# Patient Record
Sex: Female | Born: 1950 | Race: White | Hispanic: Yes | Marital: Married | Smoking: Never smoker
Health system: Southern US, Community
[De-identification: ages and names within clinical notes are randomized; demographics above are authoritative.]

## PROBLEM LIST (undated history)

## (undated) DIAGNOSIS — E079 Disorder of thyroid, unspecified: Secondary | ICD-10-CM

---

## 2006-12-09 ENCOUNTER — Ambulatory Visit: Payer: Self-pay | Admitting: Family Medicine

## 2006-12-09 ENCOUNTER — Ambulatory Visit: Payer: Self-pay | Admitting: *Deleted

## 2007-01-08 ENCOUNTER — Ambulatory Visit: Payer: Self-pay | Admitting: Internal Medicine

## 2007-01-20 ENCOUNTER — Encounter (INDEPENDENT_AMBULATORY_CARE_PROVIDER_SITE_OTHER): Payer: Self-pay | Admitting: Family Medicine

## 2007-01-20 LAB — CONVERTED CEMR LAB
Basophils Absolute: 0 10*3/uL (ref 0.0–0.1)
Basophils Relative: 1 % (ref 0–1)
HCT: 38.8 % (ref 36.0–46.0)
Lymphocytes Relative: 39 % (ref 12–46)
MCHC: 30.9 g/dL (ref 30.0–36.0)
MCV: 91.1 fL (ref 78.0–100.0)
Neutro Abs: 2.6 10*3/uL (ref 1.7–7.7)
RBC: 4.26 M/uL (ref 3.87–5.11)
WBC: 6.3 10*3/uL (ref 4.0–10.5)

## 2007-02-11 ENCOUNTER — Encounter (INDEPENDENT_AMBULATORY_CARE_PROVIDER_SITE_OTHER): Payer: Self-pay | Admitting: Nurse Practitioner

## 2007-02-11 ENCOUNTER — Ambulatory Visit: Payer: Self-pay | Admitting: Family Medicine

## 2007-02-11 LAB — CONVERTED CEMR LAB
ALT: 40 units/L — ABNORMAL HIGH (ref 0–35)
AST: 39 units/L — ABNORMAL HIGH (ref 0–37)
Alkaline Phosphatase: 126 units/L — ABNORMAL HIGH (ref 39–117)
CO2: 24 meq/L (ref 19–32)
Calcium: 9.8 mg/dL (ref 8.4–10.5)
Glucose, Bld: 96 mg/dL (ref 70–99)
Total Bilirubin: 0.4 mg/dL (ref 0.3–1.2)

## 2007-02-12 ENCOUNTER — Encounter (INDEPENDENT_AMBULATORY_CARE_PROVIDER_SITE_OTHER): Payer: Self-pay | Admitting: Nurse Practitioner

## 2007-02-12 LAB — CONVERTED CEMR LAB
Hep A Total Ab: POSITIVE — AB
Hep B Core Total Ab: NEGATIVE

## 2007-03-24 ENCOUNTER — Ambulatory Visit: Payer: Self-pay | Admitting: Family Medicine

## 2007-03-25 ENCOUNTER — Ambulatory Visit (HOSPITAL_COMMUNITY): Admission: RE | Admit: 2007-03-25 | Discharge: 2007-03-25 | Payer: Self-pay | Admitting: Family Medicine

## 2007-04-01 ENCOUNTER — Ambulatory Visit: Payer: Self-pay | Admitting: Family Medicine

## 2007-04-01 LAB — CONVERTED CEMR LAB: Free T4: 1.07 ng/dL (ref 0.89–1.80)

## 2007-04-24 ENCOUNTER — Ambulatory Visit: Payer: Self-pay | Admitting: Family Medicine

## 2007-04-29 ENCOUNTER — Ambulatory Visit (HOSPITAL_COMMUNITY): Admission: RE | Admit: 2007-04-29 | Discharge: 2007-04-29 | Payer: Self-pay | Admitting: Family Medicine

## 2007-07-16 ENCOUNTER — Ambulatory Visit: Payer: Self-pay | Admitting: Family Medicine

## 2007-08-07 ENCOUNTER — Ambulatory Visit: Payer: Self-pay | Admitting: Internal Medicine

## 2007-09-07 ENCOUNTER — Ambulatory Visit: Payer: Self-pay | Admitting: Family Medicine

## 2007-09-07 ENCOUNTER — Encounter (INDEPENDENT_AMBULATORY_CARE_PROVIDER_SITE_OTHER): Payer: Self-pay | Admitting: Family Medicine

## 2007-11-27 ENCOUNTER — Ambulatory Visit: Payer: Self-pay | Admitting: Family Medicine

## 2008-01-15 ENCOUNTER — Encounter: Admission: RE | Admit: 2008-01-15 | Discharge: 2008-01-15 | Payer: Self-pay | Admitting: Neurosurgery

## 2008-01-29 ENCOUNTER — Encounter: Admission: RE | Admit: 2008-01-29 | Discharge: 2008-01-29 | Payer: Self-pay | Admitting: Neurosurgery

## 2008-03-14 ENCOUNTER — Ambulatory Visit (HOSPITAL_COMMUNITY): Admission: RE | Admit: 2008-03-14 | Discharge: 2008-03-14 | Payer: Self-pay | Admitting: Neurosurgery

## 2008-03-18 ENCOUNTER — Inpatient Hospital Stay (HOSPITAL_COMMUNITY): Admission: RE | Admit: 2008-03-18 | Discharge: 2008-03-21 | Payer: Self-pay | Admitting: Neurosurgery

## 2008-05-31 ENCOUNTER — Emergency Department (HOSPITAL_COMMUNITY): Admission: EM | Admit: 2008-05-31 | Discharge: 2008-05-31 | Payer: Self-pay | Admitting: Emergency Medicine

## 2008-06-06 ENCOUNTER — Emergency Department (HOSPITAL_COMMUNITY): Admission: EM | Admit: 2008-06-06 | Discharge: 2008-06-06 | Payer: Self-pay | Admitting: Emergency Medicine

## 2008-06-14 ENCOUNTER — Ambulatory Visit: Payer: Self-pay | Admitting: Family Medicine

## 2008-06-14 LAB — CONVERTED CEMR LAB
ALT: 42 units/L — ABNORMAL HIGH (ref 0–35)
AST: 35 units/L (ref 0–37)
Albumin: 4.2 g/dL (ref 3.5–5.2)
Basophils Relative: 1 % (ref 0–1)
CO2: 21 meq/L (ref 19–32)
Chloride: 104 meq/L (ref 96–112)
Eosinophils Absolute: 0.8 10*3/uL — ABNORMAL HIGH (ref 0.0–0.7)
Eosinophils Relative: 11 % — ABNORMAL HIGH (ref 0–5)
Hemoglobin: 12.5 g/dL (ref 12.0–15.0)
Lymphs Abs: 3.2 10*3/uL (ref 0.7–4.0)
MCHC: 31.6 g/dL (ref 30.0–36.0)
MCV: 83.9 fL (ref 78.0–100.0)
Monocytes Relative: 9 % (ref 3–12)
Platelets: 294 10*3/uL (ref 150–400)
RDW: 14.2 % (ref 11.5–15.5)
Sodium: 138 meq/L (ref 135–145)
Total Bilirubin: 0.2 mg/dL — ABNORMAL LOW (ref 0.3–1.2)

## 2008-06-20 ENCOUNTER — Ambulatory Visit (HOSPITAL_COMMUNITY): Admission: RE | Admit: 2008-06-20 | Discharge: 2008-06-20 | Payer: Self-pay | Admitting: Family Medicine

## 2008-07-01 ENCOUNTER — Ambulatory Visit: Payer: Self-pay | Admitting: Family Medicine

## 2008-07-01 LAB — CONVERTED CEMR LAB
T3 Uptake Ratio: 31.2 % (ref 22.5–37.0)
T4, Total: 9.7 ug/dL (ref 5.0–12.5)

## 2008-07-26 ENCOUNTER — Encounter: Admission: RE | Admit: 2008-07-26 | Discharge: 2008-08-18 | Payer: Self-pay | Admitting: Neurosurgery

## 2010-07-16 ENCOUNTER — Encounter: Payer: Self-pay | Admitting: Neurosurgery

## 2010-08-20 IMAGING — CT CT CHEST W/ CM
2 of 3 series · 15 of 36 positions shown, 18 images · IV contrast (APPLIED)
Comparison: 05/31/2008 chest x-ray

CLINICAL DATA: Abnormal preoperative chest x-ray with left lower
lobe nodule.

CT CHEST WITH CONTRAST
TECHNIQUE: Multidetector CT imaging of the chest was performed
following the standard protocol during bolus administration of
intravenous contrast.
Contrast: 80 ml 8mnipaque-PII

[Series 2: routine chest 5.0 st · axial · 0.72mm/px · z∈[-274,-54]mm · 12 of 52 slices shown, 15 images]
[im 4/52  mediastinal]
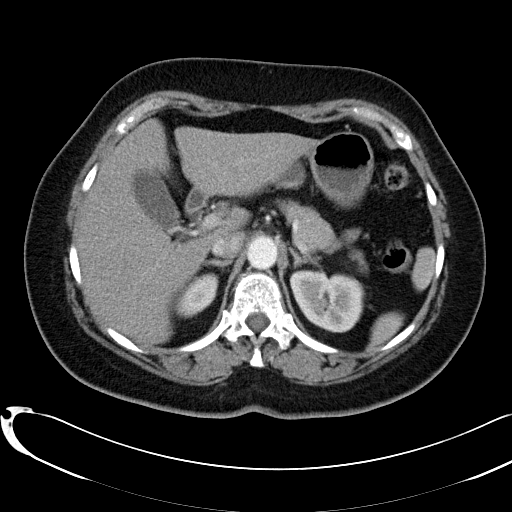
[im 4/52  lung]
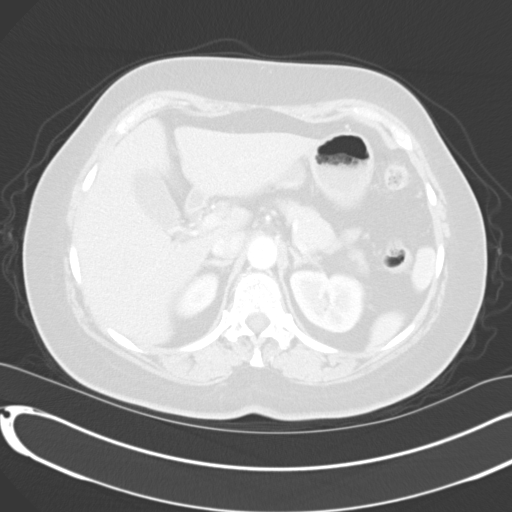
[im 8/52  lung]
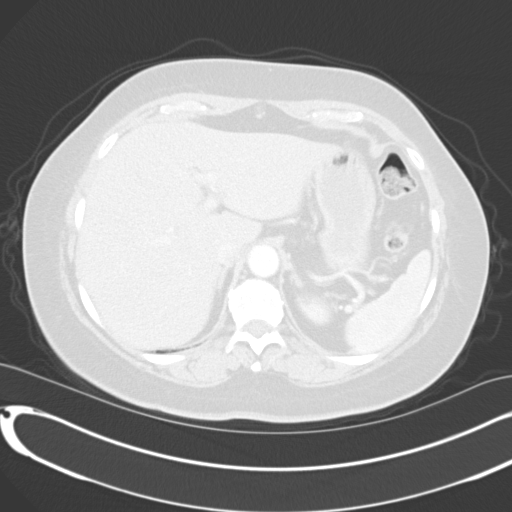
[im 12/52  lung]
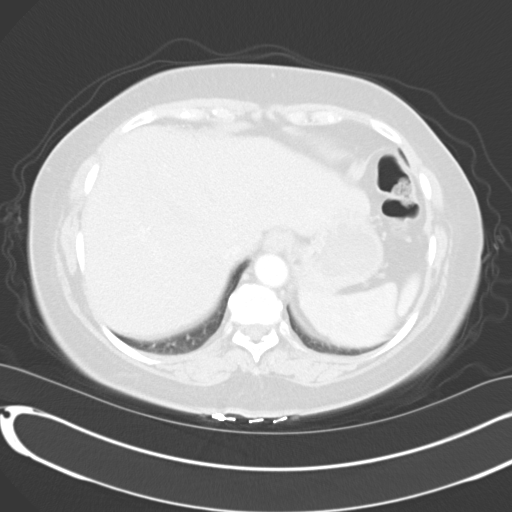
[im 16/52  lung]
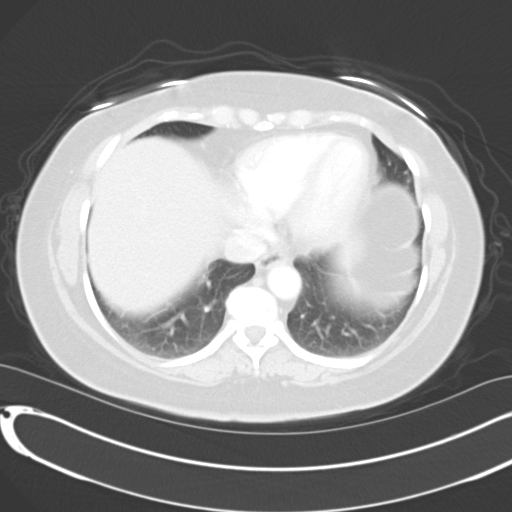
[im 19/52  mediastinal]
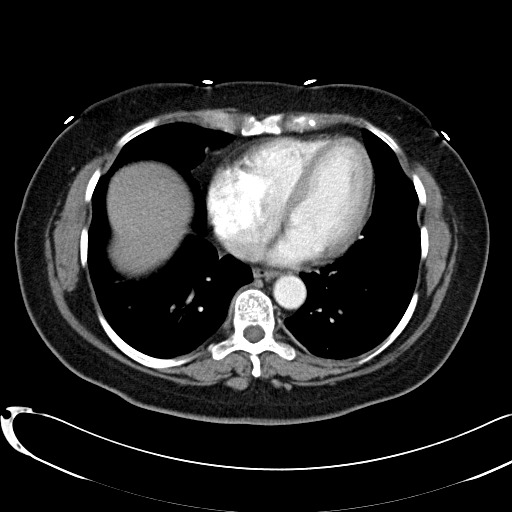
[im 19/52  lung]
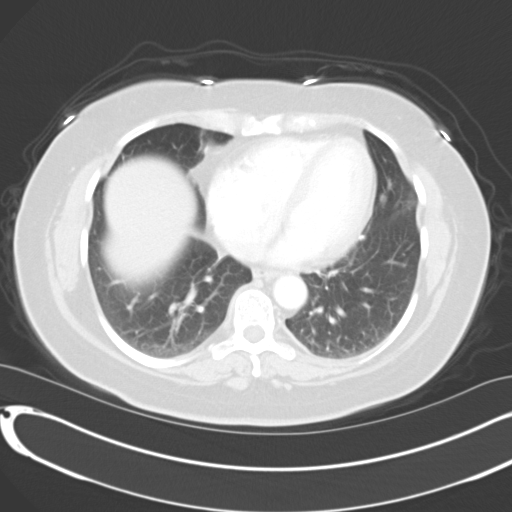
[im 23/52  lung]
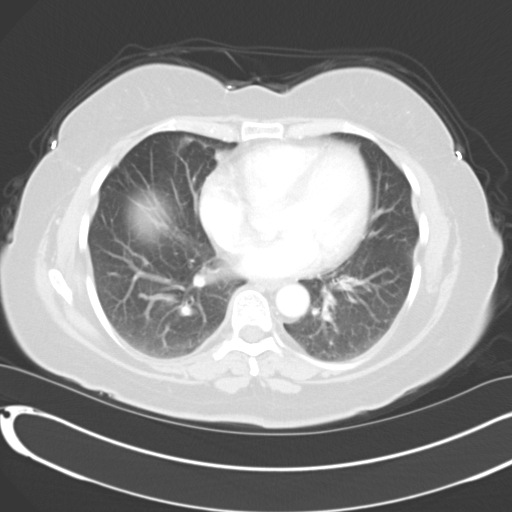
[im 29/52  lung]
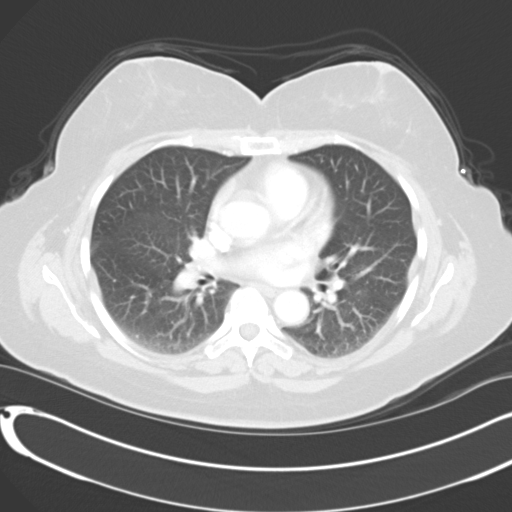
[im 33/52  lung]
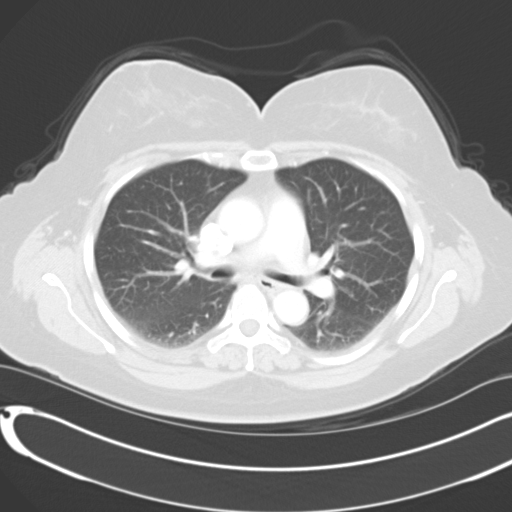
[im 36/52  mediastinal]
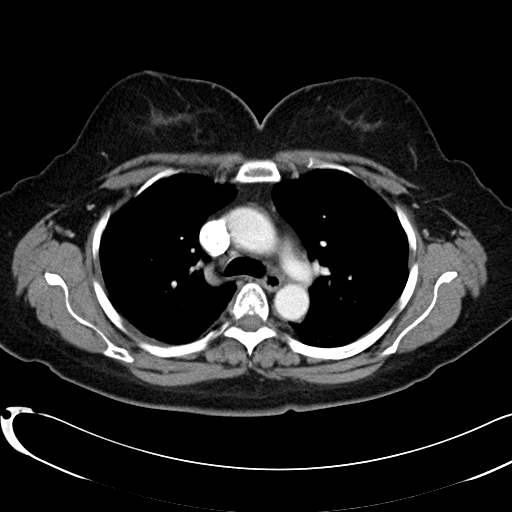
[im 36/52  lung]
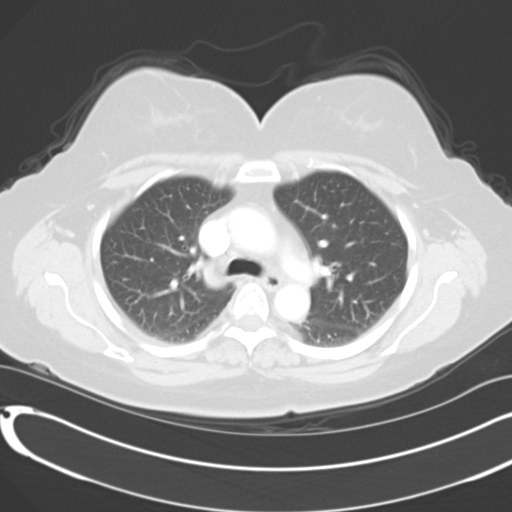
[im 40/52  lung]
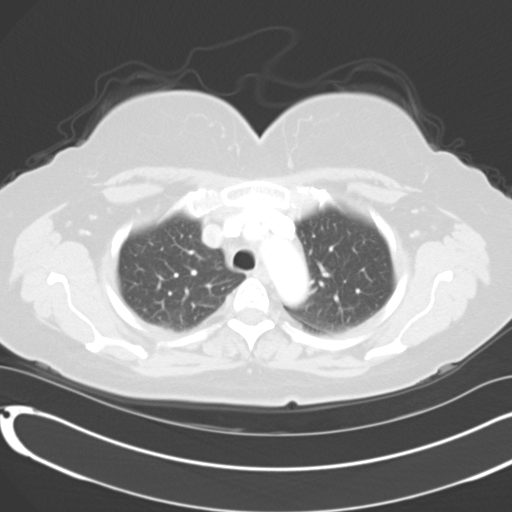
[im 44/52  lung]
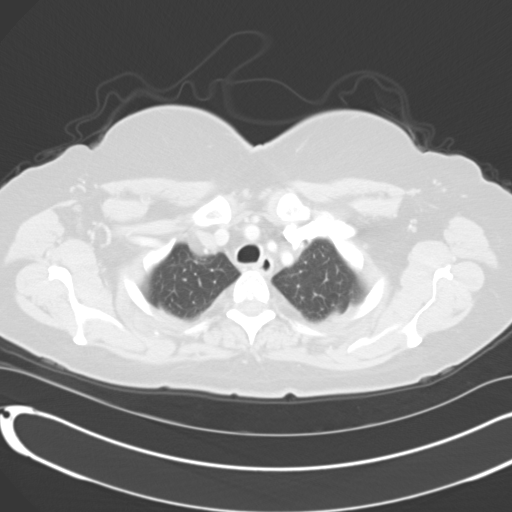
[im 48/52  lung]
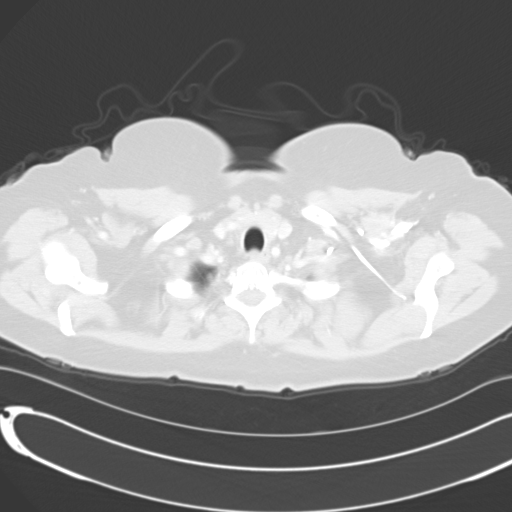

[Series 5: routine chest 2.0 st · coronal · 0.62mm/px · 3 of 96 slices shown]
[im 20/96  lung]
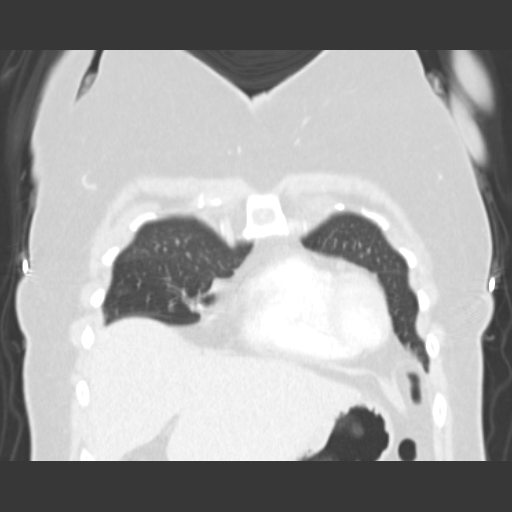
[im 39/96  lung]
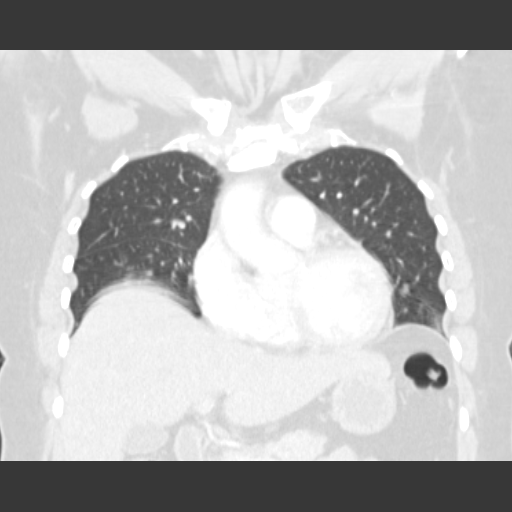
[im 58/96  lung]
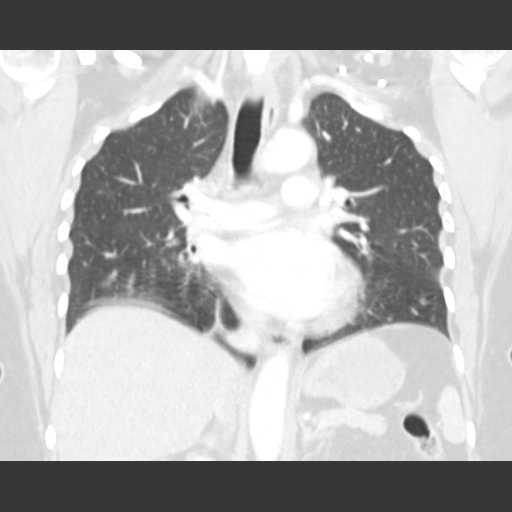

[15 of 36 positions shown; findings below may reference images not displayed]

FINDINGS: No pathologically enlarged mediastinal, hilar or axillary
lymph nodes.  Heart size normal.  No pericardial effusion.

A lingular calcified granuloma accounts for finding on recent chest
x-ray.  Minimal dependent atelectasis is seen bilaterally.  Lungs
otherwise clear.  No pleural fluid.  Airway unremarkable.

Incidental imaging of the upper abdomen reveals no acute findings.
No worrisome lytic or sclerotic lesions.
IMPRESSION: 1.  No acute findings.
2.  A calcified granuloma in the lingula accounts for questioned
abnormality on recent chest x-ray.

## 2010-11-06 NOTE — H&P (Signed)
NAME:  Leslie Maynard, Leslie Maynard      ACCOUNT NO.:  [id number]   MEDICAL RECORD NO.:  [id number]          PATIENT TYPE:  INP   LOCATION:  3011                         FACILITY:  [facility]   PHYSICIAN:  Michel Harrow, M.D.     DATE OF BIRTH:  July 21, 1950   DATE OF ADMISSION:  03/18/2008  DATE OF DISCHARGE:                              HISTORY & PHYSICAL   The patient is a lady who was seen by me about 2 months ago complaining  of back pain that has been going on for more than 10 years.  The pain is  going to both her legs.  It is getting worse with walking.  The patient  was in [place], and she was told that she needed some type of surgery.  Nevertheless, she came here.  She is not any better; she is getting  worse.  Walking makes things worse.   PAST MEDICAL HISTORY:  Negative.   FAMILY HISTORY:  Unremarkable.   REVIEW OF SYSTEMS:  Positive for back pain and bilateral leg pain.   SOCIAL HISTORY:  Negative.   PHYSICAL EXAMINATION:  The patient entered to my office with 2  daughters.  She will have difficulty in sitting and standing.  HEAD:  Normal.  NECK:  Normal.  LUNGS:  Clear.  HEART:  Sound normal.  ABDOMEN:  Normal.  EXTREMITY:  Normal pulse.  She has had decreased flexibility to lumbar spine.  Straight leg raising  is positive bilateral at 45 degrees.  She has no __________of her both  legs.   X-rays show a very __________ spondylolisthesis at L5-S1 with foraminal  stenosis.  Clinical impression,  L5-S1 spondylolisthesis with a  __________ radiculopathy.   RECOMMENDATIONS:  After she has failed conservative treatment, she  wanted to proceed with surgery.  The procedure will be L5-S1 diskectomy  __________  pedicle screw.  The risk and benefits were explained to her  and her daughter.  She was __________ showed no improvement whatsoever  because of the chronicity of the pain.           ______________________________  Michel Harrow, M.D.     KB/MEDQ  D:  03/18/2008  T:   03/19/2008  Job:  [id number]

## 2010-11-06 NOTE — Op Note (Signed)
NAME:  Leslie Maynard, Leslie Maynard      ACCOUNT NO.:  [id number]   MEDICAL RECORD NO.:  [id number]          PATIENT TYPE:  INP   LOCATION:  3011                         FACILITY:  [facility]   PHYSICIAN:  Hilda Lias, M.D.   DATE OF BIRTH:  Jun 08, 1951   DATE OF PROCEDURE:  03/18/2008  DATE OF DISCHARGE:                               OPERATIVE REPORT   PREOPERATIVE DIAGNOSIS:  L5-S1 spondylolisthesis with chronic  radiculopathy.   POSTOPERATIVE DIAGNOSIS:  L5-S1 spondylolisthesis with chronic  radiculopathy.   PROCEDURE:  L5 laminectomy with facetectomy, total bilateral L5-S1  diskectomy, interbody fusion with cages 10 x 22, pedicle screws L5-S1,  posterolateral arthrodesis with autograft and BMP.  Cell saver and C-  arms.   SURGEON:  Hilda Lias, MD.   CLINICAL HISTORY:  The lady is 60 years old complaining of back pain for  many years.  The patient was seen in [place] where she was advised to  have a back fusion.  She had been seen by me in my office.  She is  getting worse.  X-ray shows grade 1 spondylolisthesis with chronic  radiculopathy.  Surgery was advised, and the risk was explained in  history and physical.   PROCEDURE:  [name] was taken to the OR where she was positioned in a  prone manner.  The skin was cleaned with DuraPrep.  A midline incision  from L4-5 down to the L5-S1 was made, and muscle was retracted all the  way laterally until we were able to feel and see the transverse process  of L5.  Later, we removed the spinous process of L5 as well as the  facets.  Then, we entered the disk space.  The patient has a herniated  disk, the left with 2 or 3 fragments.   The incision was made, and a total gross diskectomy was done.  In this  case, we had to do a diskectomy more than normal, just to allow [place] to do  a interbody fusion.  The end plates were drilled.  Then, 2 cages of 10 x  22 with BMP and autograft were inserted.  The rest of the disk space was  filled up  with the autograft.  Then, using the C-arm, in the AP view and  later on the lateral view, we viewed the pedicle of L5-S1 and 4 screws  were inserted.  This was followed by a rod and caps.  X-ray was taken  which showed good position of the pedicular screws.  We investigated the  foramina and there was plenty of space for the L5-S1 nerve root, and  there was no __________  medial-ward of the pedicle were preserved.  Then, __________was left in the epidural space and the wound was closed  with 0 Vicryl and Steri-Strips.           ______________________________  Hilda Lias, M.D.     EB/MEDQ  D:  03/18/2008  T:  03/19/2008  Job:  [id number]

## 2010-11-06 NOTE — Discharge Summary (Signed)
NAME:  Leslie Maynard, Leslie Maynard      ACCOUNT NO.:  [id number]   MEDICAL RECORD NO.:  [id number]          PATIENT TYPE:  INP   LOCATION:  3011                         FACILITY:  [facility]   PHYSICIAN:  Hilda Lias, M.D.   DATE OF BIRTH:  Nov 08, 1950   DATE OF ADMISSION:  03/18/2008  DATE OF DISCHARGE:  03/21/2008                               DISCHARGE SUMMARY   ADMISSION DIAGNOSIS:  L5-S1 spondylolisthesis with chronic  radiculopathy.   FINAL DIAGNOSIS:  L5-S1 spondylolisthesis with chronic radiculopathy.   CLINICAL HISTORY:  The patient has been complaining of back pain for  many years.  X-ray showed that the spondylolisthesis of L5-S1.  Surgery  was advised.   LABORATORY:  Normal.   COURSE IN THE HOSPITAL:  The patient was taken to surgery and L5-S1  fusion was done today.  Less than 72 hours, she is ambulating with  minimal discomfort.  She is going to be discharged to be followed by me.   CONDITION ON DISCHARGE:  Improvement.   MEDICATIONS:  Vicodin and diazepam.   DIET:  Regular.   ACTIVITY:  Not to drive for at least 3 or 4 weeks.   FOLLOWUP:  To be seen by me in 4 weeks.     ______________________________  Hilda Lias, M.D.    ______________________________  Hilda Lias, M.D.    EB/MEDQ  D:  03/21/2008  T:  03/22/2008  Job:  [id number]

## 2011-03-25 LAB — TYPE AND SCREEN
ABO/RH(D): A POS
Antibody Screen: NEGATIVE

## 2011-03-25 LAB — CBC
MCHC: 33.2
MCV: 86.8
RDW: 13.6
WBC: 6.3

## 2011-03-25 LAB — ABO/RH: ABO/RH(D): A POS

## 2011-03-29 LAB — URINALYSIS, ROUTINE W REFLEX MICROSCOPIC
Glucose, UA: NEGATIVE mg/dL
Hgb urine dipstick: NEGATIVE
Specific Gravity, Urine: 1.015 (ref 1.005–1.030)
Urobilinogen, UA: 0.2 mg/dL (ref 0.0–1.0)

## 2011-03-29 LAB — CSF CULTURE W GRAM STAIN: Culture: NO GROWTH

## 2011-03-29 LAB — CSF CELL COUNT WITH DIFFERENTIAL
RBC Count, CSF: 16 /mm3 — ABNORMAL HIGH
WBC, CSF: 2 /mm3 (ref 0–5)

## 2011-03-29 LAB — GRAM STAIN

## 2011-03-29 LAB — BASIC METABOLIC PANEL WITH GFR
BUN: 9 mg/dL (ref 6–23)
CO2: 21 meq/L (ref 19–32)
Calcium: 9.2 mg/dL (ref 8.4–10.5)
Chloride: 105 meq/L (ref 96–112)
Creatinine, Ser: 0.58 mg/dL (ref 0.4–1.2)
GFR calc non Af Amer: >60 mL/min
Glucose, Bld: 122 mg/dL — ABNORMAL HIGH (ref 70–99)
Potassium: 3.6 meq/L (ref 3.5–5.1)
Sodium: 137 meq/L (ref 135–145)

## 2011-03-29 LAB — CULTURE, BLOOD (ROUTINE X 2): Culture: NO GROWTH

## 2011-03-29 LAB — URINE MICROSCOPIC-ADD ON

## 2011-03-29 LAB — VDRL, CSF: VDRL Quant, CSF: NONREACTIVE

## 2011-03-29 LAB — MONONUCLEOSIS SCREEN: Mono Screen: NEGATIVE

## 2016-12-27 ENCOUNTER — Emergency Department (HOSPITAL_COMMUNITY): Payer: Self-pay

## 2016-12-27 ENCOUNTER — Emergency Department (HOSPITAL_COMMUNITY)
Admission: EM | Admit: 2016-12-27 | Discharge: 2016-12-27 | Disposition: A | Discharge status: Home / Self Care | Payer: Self-pay | Attending: Emergency Medicine | Admitting: Emergency Medicine

## 2016-12-27 ENCOUNTER — Encounter (HOSPITAL_COMMUNITY): Payer: Self-pay | Admitting: Emergency Medicine

## 2016-12-27 DIAGNOSIS — R52 Pain, unspecified: Secondary | ICD-10-CM

## 2016-12-27 DIAGNOSIS — R1010 Upper abdominal pain, unspecified: Secondary | ICD-10-CM

## 2016-12-27 DIAGNOSIS — R1011 Right upper quadrant pain: Secondary | ICD-10-CM | POA: Insufficient documentation

## 2016-12-27 DIAGNOSIS — E079 Disorder of thyroid, unspecified: Secondary | ICD-10-CM | POA: Insufficient documentation

## 2016-12-27 HISTORY — DX: Disorder of thyroid, unspecified: E07.9

## 2016-12-27 LAB — COMPREHENSIVE METABOLIC PANEL
ALBUMIN: 4 g/dL (ref 3.5–5.0)
ALT: 31 U/L (ref 14–54)
AST: 38 U/L (ref 15–41)
Alkaline Phosphatase: 90 U/L (ref 38–126)
Anion gap: 8 (ref 5–15)
BILIRUBIN TOTAL: 0.8 mg/dL (ref 0.3–1.2)
BUN: 8 mg/dL (ref 6–20)
CHLORIDE: 106 mmol/L (ref 101–111)
CO2: 24 mmol/L (ref 22–32)
Calcium: 9.6 mg/dL (ref 8.9–10.3)
Creatinine, Ser: 0.69 mg/dL (ref 0.44–1.00)
GFR calc Af Amer: >60 mL/min (ref >60)
GFR calc non Af Amer: >60 mL/min (ref >60)
GLUCOSE: 103 mg/dL — AB (ref 65–99)
POTASSIUM: 4.2 mmol/L (ref 3.5–5.1)
SODIUM: 138 mmol/L (ref 135–145)
TOTAL PROTEIN: 8 g/dL (ref 6.5–8.1)

## 2016-12-27 LAB — URINALYSIS, ROUTINE W REFLEX MICROSCOPIC
BILIRUBIN URINE: NEGATIVE
Glucose, UA: NEGATIVE mg/dL
Ketones, ur: NEGATIVE mg/dL
Leukocytes, UA: NEGATIVE
NITRITE: NEGATIVE
PH: 6 (ref 5.0–8.0)
Protein, ur: NEGATIVE mg/dL
SPECIFIC GRAVITY, URINE: 1.004 — AB (ref 1.005–1.030)

## 2016-12-27 LAB — CBC
HEMATOCRIT: 39.9 % (ref 36.0–46.0)
Hemoglobin: 12.8 g/dL (ref 12.0–15.0)
MCH: 28.3 pg (ref 26.0–34.0)
MCHC: 32.1 g/dL (ref 30.0–36.0)
MCV: 88.1 fL (ref 78.0–100.0)
Platelets: 221 10*3/uL (ref 150–400)
RBC: 4.53 MIL/uL (ref 3.87–5.11)
RDW: 14.7 % (ref 11.5–15.5)
WBC: 7.7 10*3/uL (ref 4.0–10.5)

## 2016-12-27 LAB — LIPASE, BLOOD: LIPASE: 48 U/L (ref 11–51)

## 2016-12-27 MED ORDER — ONDANSETRON HCL 4 MG/2ML IJ SOLN
4.0000 mg | Freq: Once | INTRAMUSCULAR | Status: AC
  Administered 2016-12-27: 4 mg via INTRAVENOUS
  Filled 2016-12-27: qty 2

## 2016-12-27 MED ORDER — HYDROCODONE-ACETAMINOPHEN 5-325 MG PO TABS: 1.0000 | ORAL_TABLET | Freq: Four times a day (QID) | ORAL | 0 refills | Status: AC | PRN

## 2016-12-27 MED ORDER — SODIUM CHLORIDE 0.9 % IV BOLUS (SEPSIS)
500.0000 mL | Freq: Once | INTRAVENOUS | Status: AC
  Administered 2016-12-27: 500 mL via INTRAVENOUS

## 2016-12-27 MED ORDER — ONDANSETRON 4 MG PO TBDP
8.0000 mg | ORAL_TABLET | Freq: Once | ORAL | Status: AC
  Administered 2016-12-27: 8 mg via ORAL
  Filled 2016-12-27: qty 2

## 2016-12-27 MED ORDER — HYDROMORPHONE HCL 1 MG/ML IJ SOLN
1.0000 mg | Freq: Once | INTRAMUSCULAR | Status: AC
  Administered 2016-12-27: 1 mg via INTRAVENOUS
  Filled 2016-12-27: qty 1

## 2016-12-27 MED ORDER — ONDANSETRON 4 MG PO TBDP: ORAL_TABLET | ORAL | 0 refills | Status: AC

## 2016-12-27 MED ORDER — IOPAMIDOL (ISOVUE-300) INJECTION 61%
INTRAVENOUS | Status: AC
  Administered 2016-12-27: 100 mL via INTRAVENOUS
  Filled 2016-12-27: qty 100

## 2016-12-27 NOTE — ED Provider Notes (Signed)
[facility]EMER[facility]ENCY DEPT Provider Note   CSN: [id number] Arrival date & time: 12/27/16  1828     History   Chief Complaint Chief Complaint  Patient presents with  . Abdominal Pain    HPI Leslie Maynard is a 66 y.o. female.  Patient complains of abdominal pain. She also has some nausea and some vomiting. This is been going on for weeks.   The history is provided by the patient. No language interpreter was used.  Abdominal Pain   This is a recurrent problem. The current episode started more than 1 week ago. The problem occurs constantly. The problem has not changed since onset.The pain is associated with an unknown factor. The pain is located in the RUQ. The quality of the pain is aching. The pain is at a severity of 5/10. The pain is moderate. Associated symptoms include anorexia. Pertinent negatives include diarrhea, frequency, hematuria and headaches. Nothing aggravates the symptoms.    Past Medical History:  Diagnosis Date  . Thyroid disease     There are no active problems to display for this patient.   History reviewed. No pertinent surgical history.  OB History    No data available       Home Medications    Prior to Admission medications   Medication Sig Start Date End Date Taking? Authorizing Provider  HYDROcodone-acetaminophen (NORCO/VICODIN) 5-325 MG tablet Take 1 tablet by mouth every 6 (six) hours as needed for moderate pain. 12/27/16   Bethann Berkshire, MD  ondansetron (ZOFRAN ODT) 4 MG disintegrating tablet 4mg  ODT q4 hours prn nausea/vomit 12/27/16   Bethann Berkshire, MD    Family History History reviewed. No pertinent family history.  Social History Social History  Substance Use Topics  . Smoking status: Never Smoker  . Smokeless tobacco: Never Used  . Alcohol use No     Allergies   Patient has no known allergies.   Review of Systems Review of Systems  Constitutional: Negative for appetite change and fatigue.  HENT: Negative for congestion,  ear discharge and sinus pressure.   Eyes: Negative for discharge.  Respiratory: Negative for cough.   Cardiovascular: Negative for chest pain.  Gastrointestinal: Positive for abdominal pain and anorexia. Negative for diarrhea.  Genitourinary: Negative for frequency and hematuria.  Musculoskeletal: Negative for back pain.  Skin: Negative for rash.  Neurological: Negative for seizures and headaches.  Psychiatric/Behavioral: Negative for hallucinations.     Physical Exam Updated Vital Signs BP 136/74   Pulse 83   Temp 97.7 F (36.5 C) (Oral)   Resp (!) 27   SpO2 97%   Physical Exam  Constitutional: She is oriented to person, place, and time. She appears well-developed.  HENT:  Head: Normocephalic.  Eyes: Conjunctivae and EOM are normal. No scleral icterus.  Neck: Neck supple. No thyromegaly present.  Cardiovascular: Normal rate and regular rhythm.  Exam reveals no gallop and no friction rub.   No murmur heard. Pulmonary/Chest: No stridor. She has no wheezes. She has no rales. She exhibits no tenderness.  Abdominal: She exhibits no distension. There is tenderness. There is no rebound.  Patient is moderately tender right upper quadrant right lower quadrant.  Musculoskeletal: Normal range of motion. She exhibits no edema.  Lymphadenopathy:    She has no cervical adenopathy.  Neurological: She is oriented to person, place, and time. She exhibits normal muscle tone. Coordination normal.  Skin: No rash noted. No erythema.  Psychiatric: She has a normal mood and affect. Her behavior is normal.  ED Treatments / Results  Labs (all labs ordered are listed, but only abnormal results are displayed) Labs Reviewed  COMPREHENSIVE METABOLIC PANEL - Abnormal; Notable for the following:       Result Value   Glucose, Bld 103 (*)    All other components within normal limits  URINALYSIS, ROUTINE W REFLEX MICROSCOPIC - Abnormal; Notable for the following:    Color, Urine STRAW (*)     Specific Gravity, Urine 1.004 (*)    Hgb urine dipstick SMALL (*)    Bacteria, UA RARE (*)    Squamous Epithelial / LPF 0-5 (*)    All other components within normal limits  LIPASE, BLOOD  CBC    EKG  EKG Interpretation None       Radiology Ct Abdomen Pelvis W Contrast  Result Date: 12/27/2016 CLINICAL DATA:  66 year old female with acute right abdominal and pelvic pain with nausea and vomiting for 2 days. EXAM: CT ABDOMEN AND PELVIS WITH CONTRAST TECHNIQUE: Multidetector CT imaging of the abdomen and pelvis was performed using the standard protocol following bolus administration of intravenous contrast. CONTRAST:  ISOVUE-300 IOPAMIDOL (ISOVUE-300) INJECTION 61% COMPARISON:  None. FINDINGS: Lower chest: No acute abnormality. Hepatobiliary: The liver and gallbladder are unremarkable. No biliary dilatation. Pancreas: Unremarkable Spleen: Unremarkable Adrenals/Urinary Tract: The kidneys, adrenal glands and bladder are unremarkable. Stomach/Bowel: Stomach is within normal limits. Appendix appears normal. No evidence of bowel wall thickening, distention, or inflammatory changes. Vascular/Lymphatic: No significant vascular findings are present. No enlarged abdominal or pelvic lymph nodes. Reproductive: A 4 cm left uterine mass/fibroid noted. No adnexal mass identified. Other: No free fluid, collection or pneumoperitoneum. Musculoskeletal: No acute abnormality. L5-S1 fusion changes again noted. IMPRESSION: No evidence of acute abnormality.  Normal appendix. 4 cm left uterine mass/fibroid. Electronically Signed   By: Harmon Pier M.D.   On: 12/27/2016 21:14   US Abdomen Limited Ruq  Result Date: 12/27/2016 CLINICAL DATA:  Chronic right upper quadrant abdominal pain. EXAM: ULTRASOUND ABDOMEN LIMITED RIGHT UPPER QUADRANT COMPARISON:  CT of the abdomen and pelvis performed earlier today at 9:05 p.m. FINDINGS: Gallbladder: Multiple large stones are seen within the gallbladder, measuring up to 2.7 cm in  size. No gallbladder wall thickening or pericholecystic fluid is seen. No ultrasonographic Murphy's sign is elicited. Common bile duct: Diameter: 0.5 cm, within normal limits in caliber. Liver: No focal lesion identified. Within normal limits in parenchymal echogenicity. IMPRESSION: Cholelithiasis. Gallbladder otherwise unremarkable. No acute abnormality at the right upper quadrant. Electronically Signed   By: Roanna Raider M.D.   On: 12/27/2016 22:37    Procedures Procedures (including critical care time)  Medications Ordered in ED Medications  ondansetron (ZOFRAN) injection 4 mg (4 mg Intravenous Given 12/27/16 1958)  HYDROmorphone (DILAUDID) injection 1 mg (1 mg Intravenous Given 12/27/16 1959)  sodium chloride 0.9 % bolus 500 mL (500 mLs Intravenous New Bag/Given 12/27/16 2001)  iopamidol (ISOVUE-300) 61 % injection (100 mLs Intravenous Contrast Given 12/27/16 2050)     Initial Impression / Assessment and Plan / ED Course  I have reviewed the triage vital signs and the nursing notes.  Pertinent labs & imaging results that were available during my care of the patient were reviewed by me and considered in my medical decision making (see chart for details).     Patient with gallstones but no signs of cholecystitis. Patient will be placed on pain medicine and nausea medicine and will follow-up with [facility] next week  Final Clinical Impressions(s) / ED  Diagnoses   Final diagnoses:  Pain  Pain of upper abdomen    New Prescriptions New Prescriptions   HYDROCODONE-ACETAMINOPHEN (NORCO/VICODIN) 5-325 MG TABLET    Take 1 tablet by mouth every 6 (six) hours as needed for moderate pain.   ONDANSETRON (ZOFRAN ODT) 4 MG DISINTEGRATING TABLET    4mg  ODT q4 hours prn nausea/vomit     Bethann BerkshireZammit, Helio Lack, MD 12/27/16 2254

## 2016-12-27 NOTE — ED Notes (Signed)
Patient vomiting at this time.  Dr Estell HarpinZammit notified.  New orders per Dr Estell HarpinZammit.

## 2016-12-27 NOTE — ED Notes (Signed)
Patient transported to Ultrasound 

## 2016-12-27 NOTE — ED Triage Notes (Signed)
Pt sts abd pain x months intermittently with some vomiting; pt seen in [place] and told was her gall bladder and just returned home

## 2016-12-27 NOTE — Discharge Instructions (Signed)
[facility] Call for an appointment and tell them you are seen in the emergency department and that we referred you to them because you have problems with her gallbladder

## 2016-12-27 NOTE — ED Notes (Signed)
Patient taken to CT.

## 2017-08-18 IMAGING — US US ABDOMEN LIMITED
1 series · 14 of 25 positions shown · non-contrast
Comparison: CT of the abdomen and pelvis performed earlier today at
[DATE] p.m.

CLINICAL DATA: Chronic right upper quadrant abdominal pain.

EXAM:
ULTRASOUND ABDOMEN LIMITED RIGHT UPPER QUADRANT

[Series 1: us abdomen limited · 0.25mm/px · 14 of 41 slices shown]
[im 1/41]
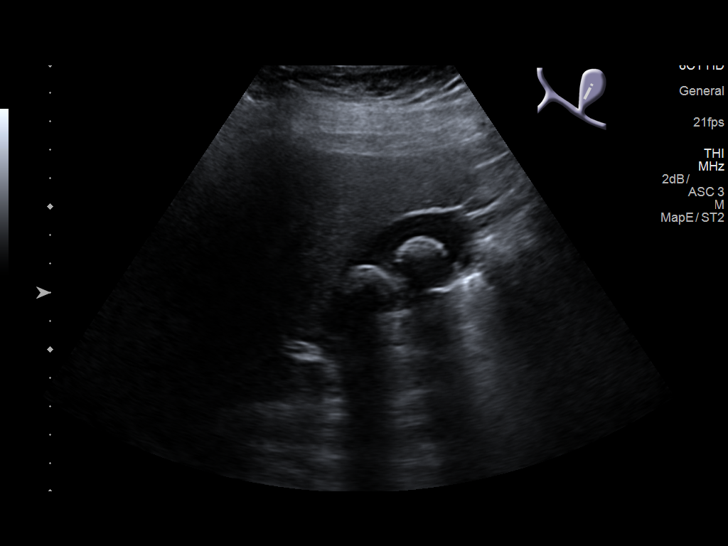
[im 4/41]
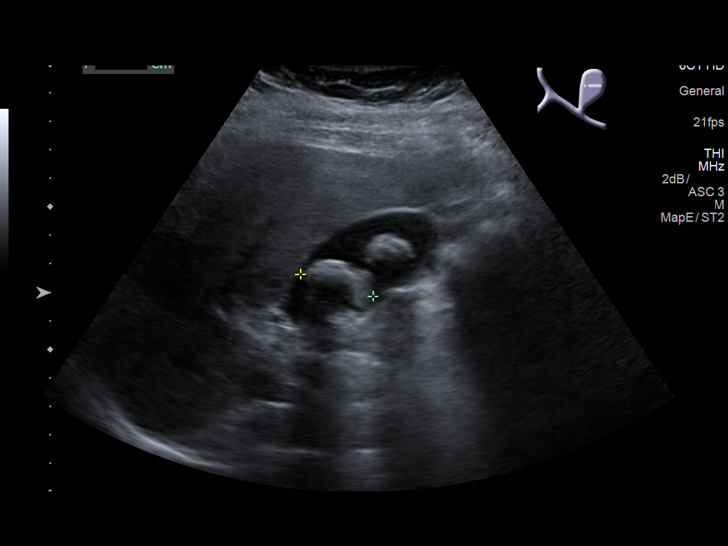
[im 7/41]
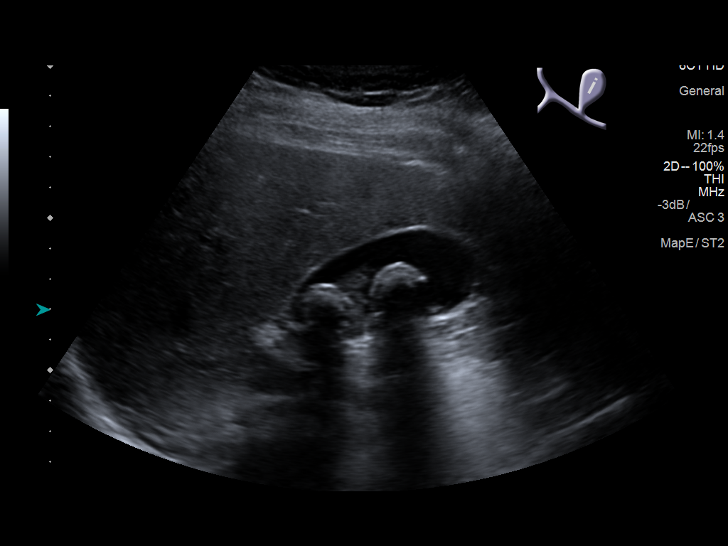
[im 11/41]
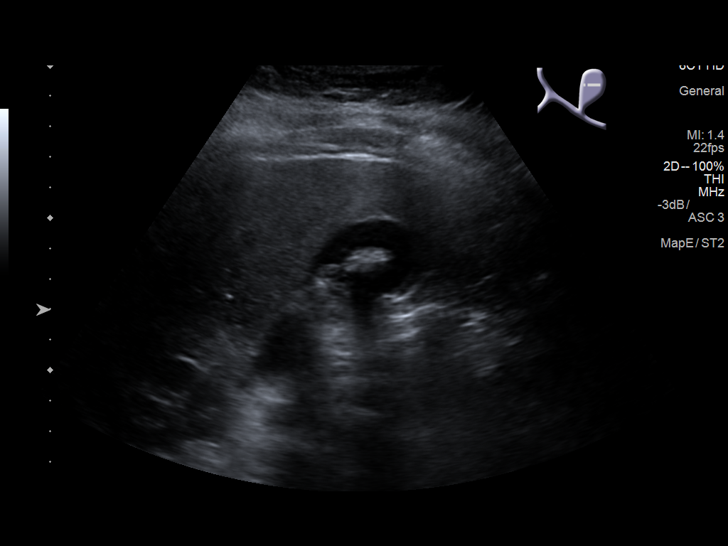
[im 14/41]
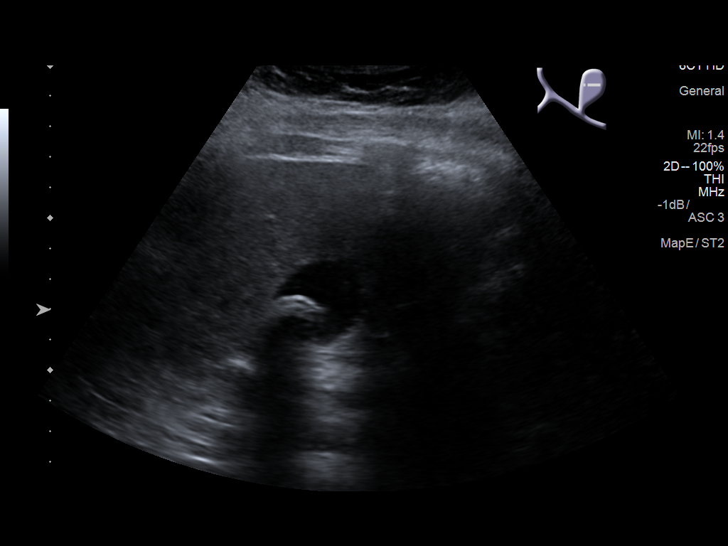
[im 16/41]
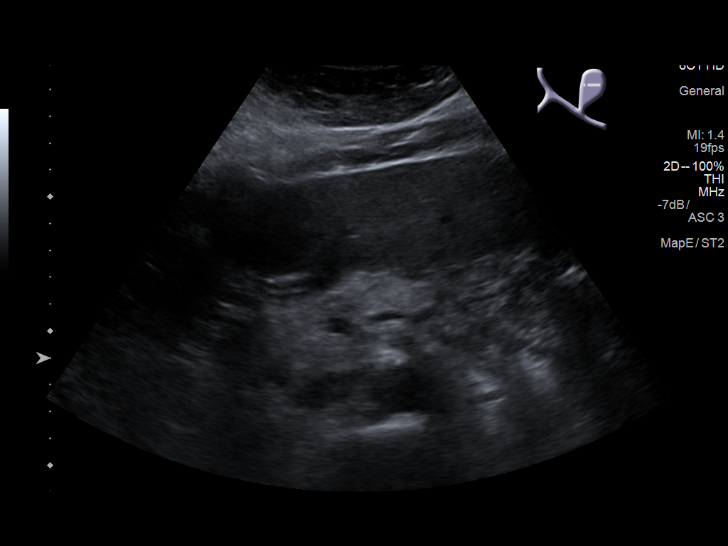
[im 19/41]
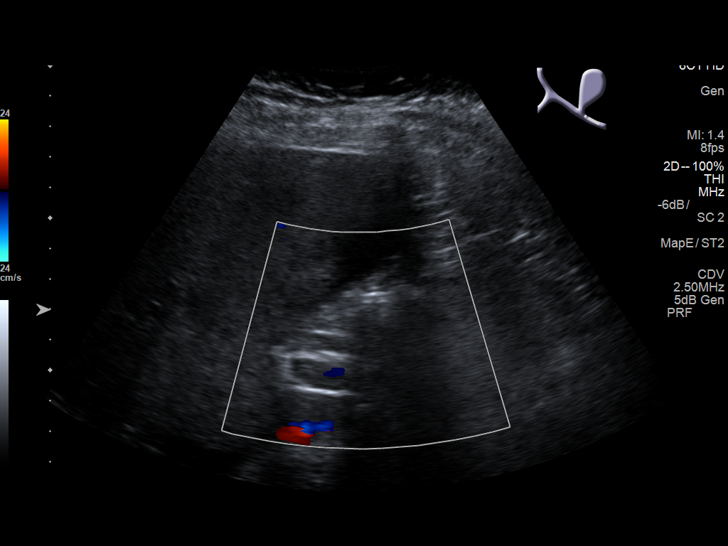
[im 22/41]
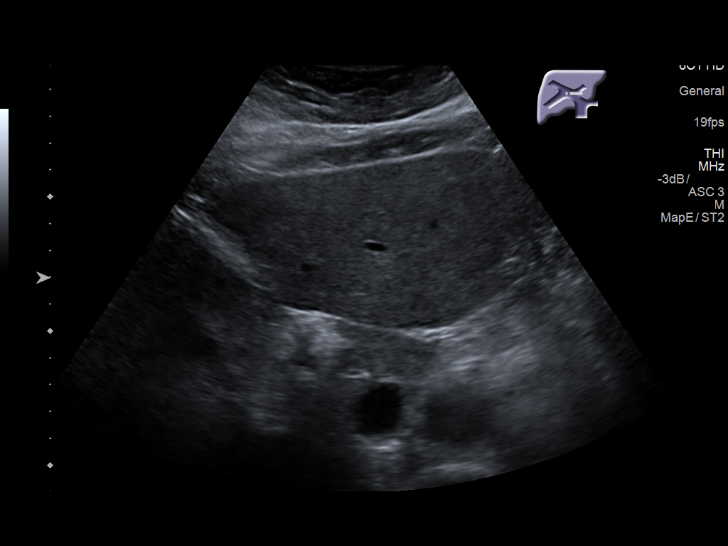
[im 26/41]
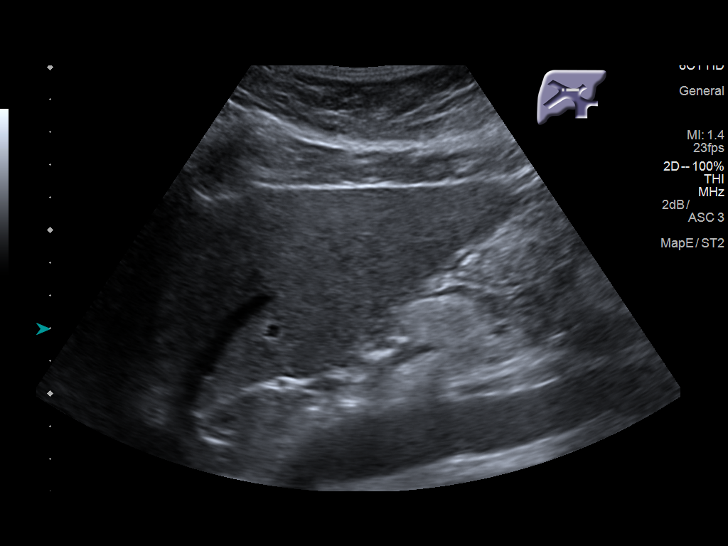
[im 27/41]
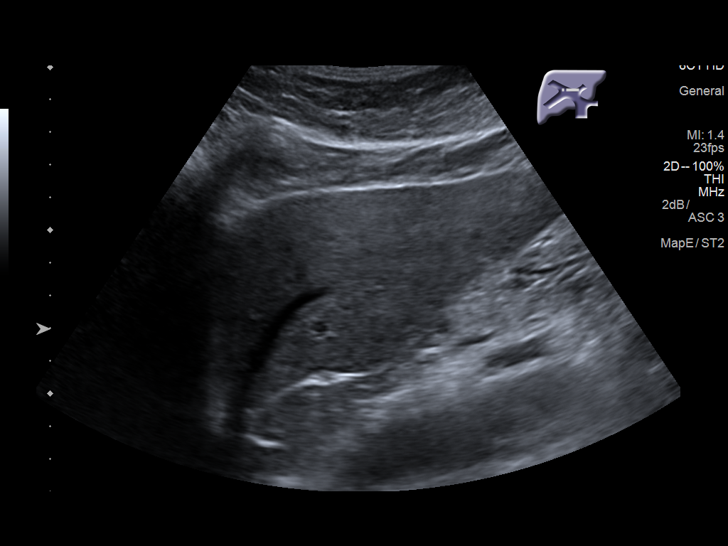
[im 31/41]
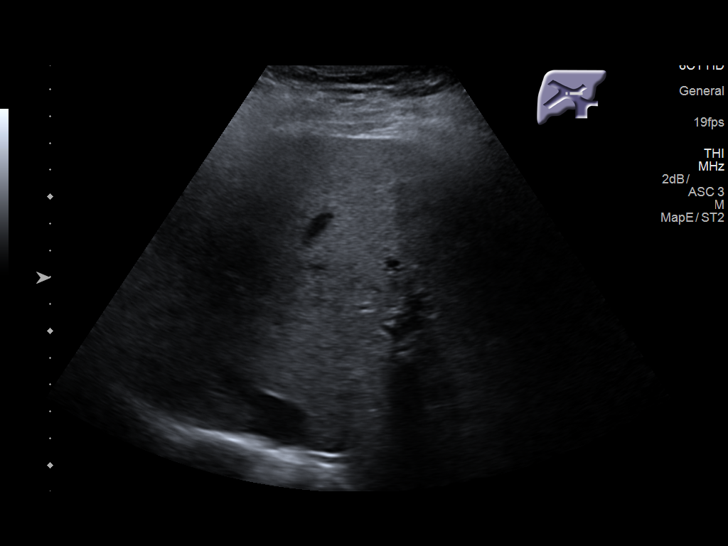
[im 34/41]
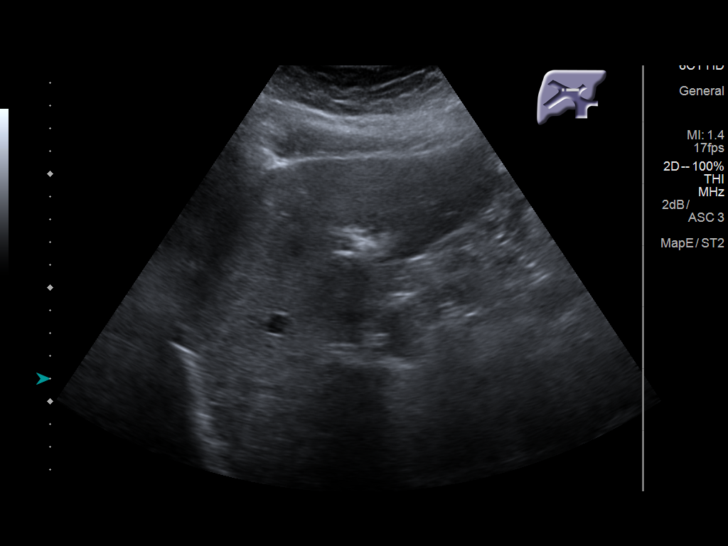
[im 37/41]
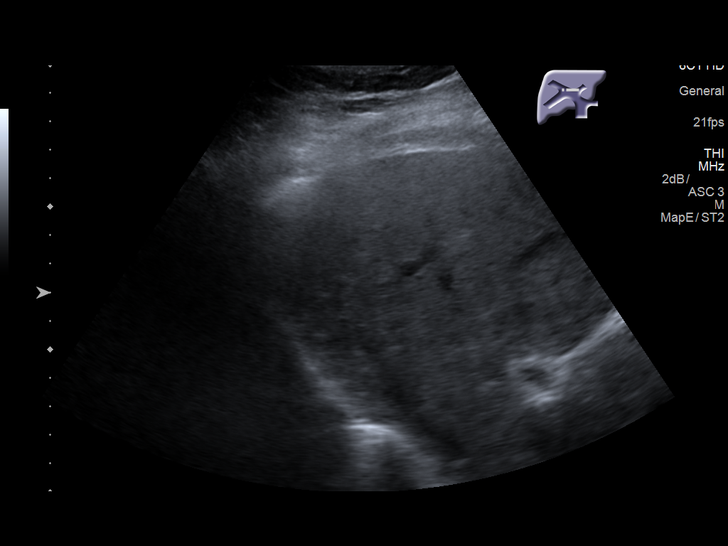
[im 41/41]
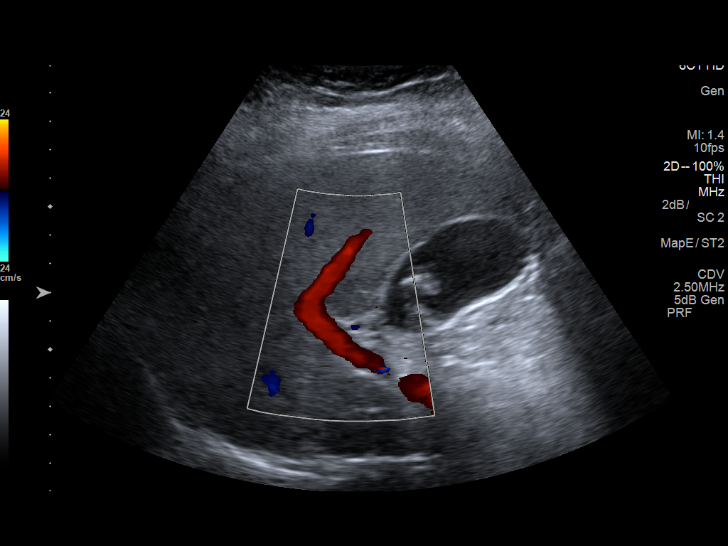

[14 of 25 positions shown; findings below may reference images not displayed]

FINDINGS: Gallbladder:

Multiple large stones are seen within the gallbladder, measuring up
to 2.7 cm in size. No gallbladder wall thickening or pericholecystic
fluid is seen. No ultrasonographic Murphy's sign is elicited.

Common bile duct:

Diameter: 0.5 cm, within normal limits in caliber.

Liver:

No focal lesion identified. Within normal limits in parenchymal
echogenicity.
IMPRESSION: Cholelithiasis. Gallbladder otherwise unremarkable. No acute
abnormality at the right upper quadrant.

## 2019-02-26 IMAGING — CT CT ABD-PELV W/ CM
2 of 5 series · 17 of 46 positions shown, 19 images · IV contrast (Omni 300)
Comparison: None.

CLINICAL DATA: 65-year-old female with acute right abdominal and
pelvic pain with nausea and vomiting for 2 days.

EXAM:
CT ABDOMEN AND PELVIS WITH CONTRAST
TECHNIQUE: Multidetector CT imaging of the abdomen and pelvis was performed
using the standard protocol following bolus administration of
intravenous contrast.
CONTRAST:  100mL UM7ZRR-TFF IOPAMIDOL (UM7ZRR-TFF) INJECTION 61%

[Series 3: a/p w/ 5mm · axial · 0.92mm/px · z∈[+723,+1123]mm · 14 of 91 slices shown, 16 images]
[im 6/91  soft-tissue]
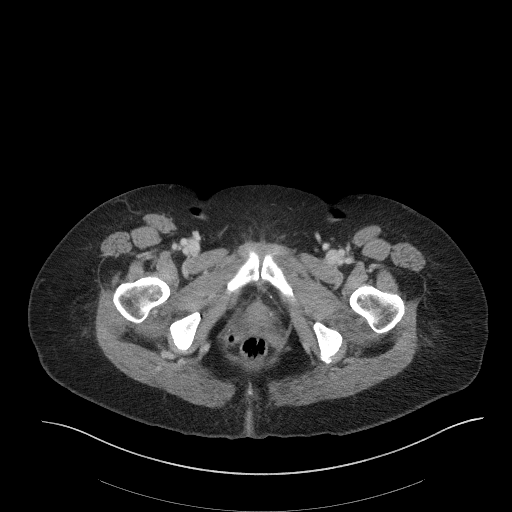
[im 6/91  bone]
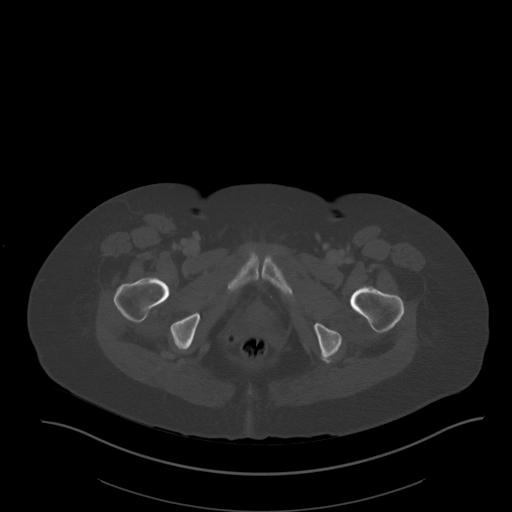
[im 11/91  soft-tissue]
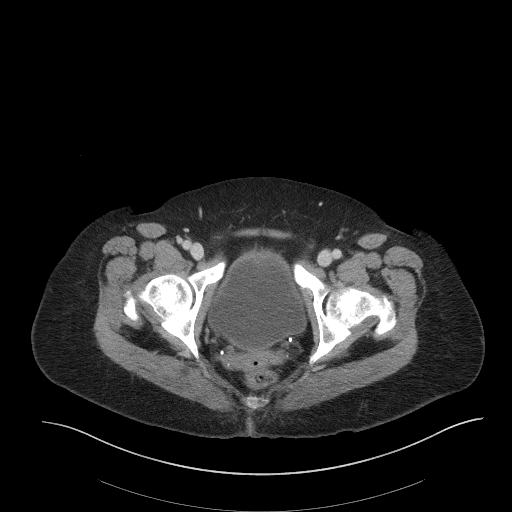
[im 21/91  soft-tissue]
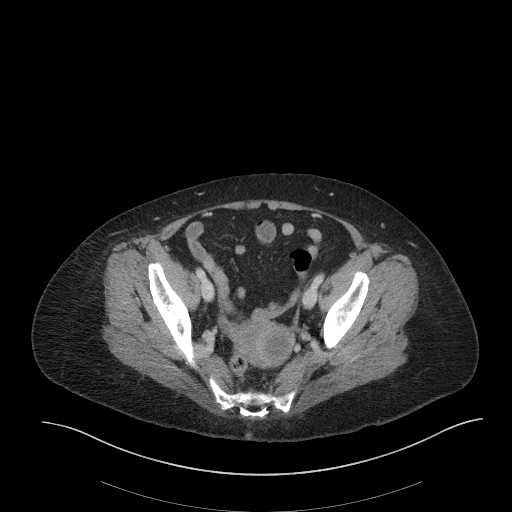
[im 26/91  soft-tissue]
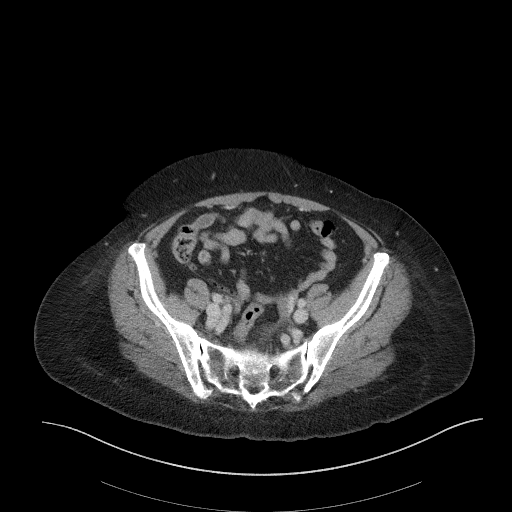
[im 31/91  soft-tissue]
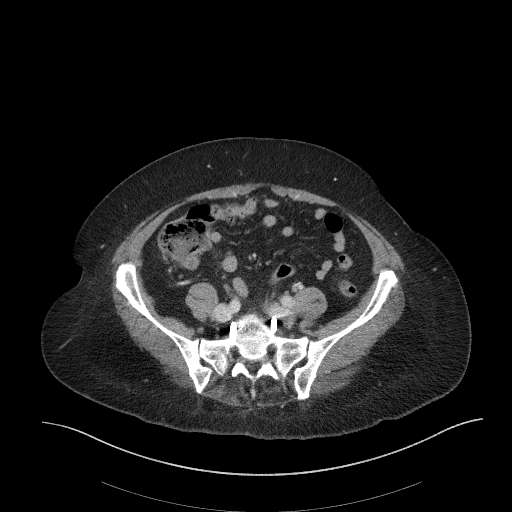
[im 36/91  soft-tissue]
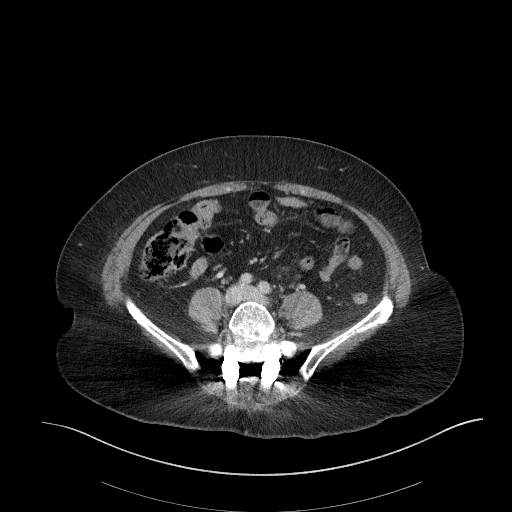
[im 41/91  soft-tissue]
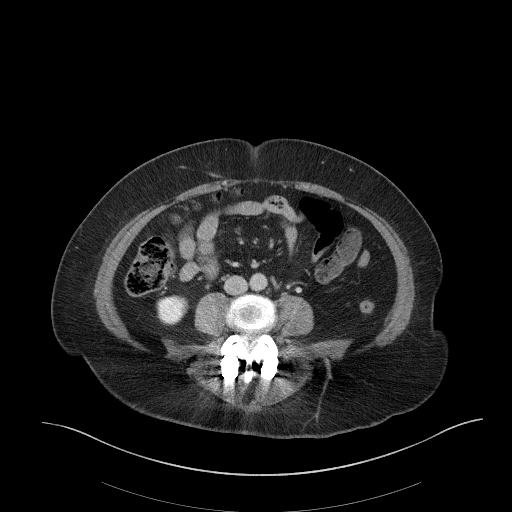
[im 51/91  soft-tissue]
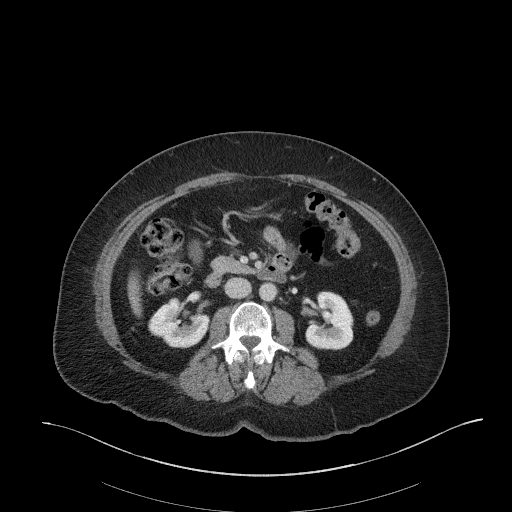
[im 56/91  soft-tissue]
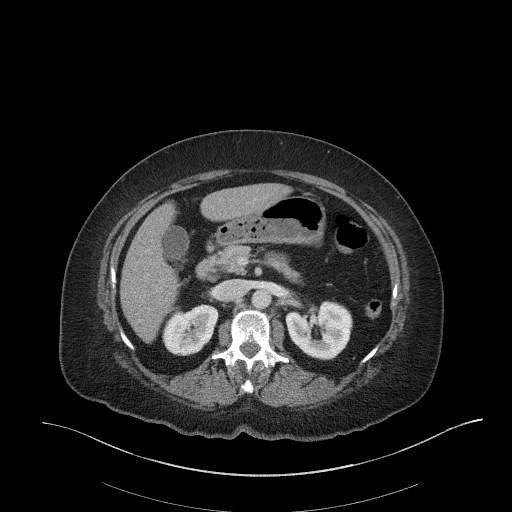
[im 56/91  bone]
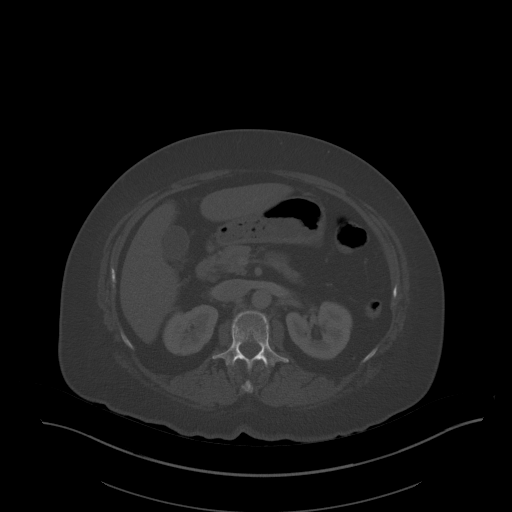
[im 61/91  soft-tissue]
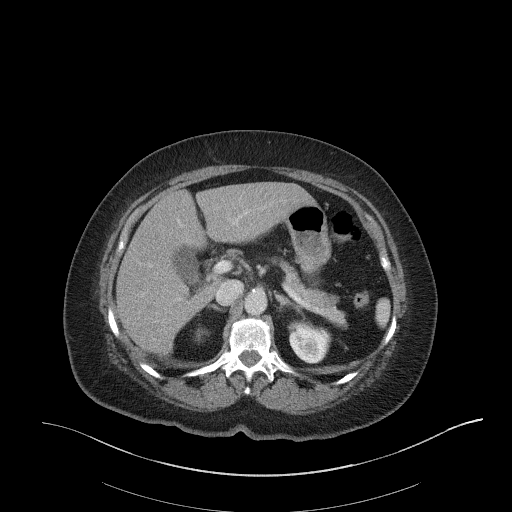
[im 66/91  soft-tissue]
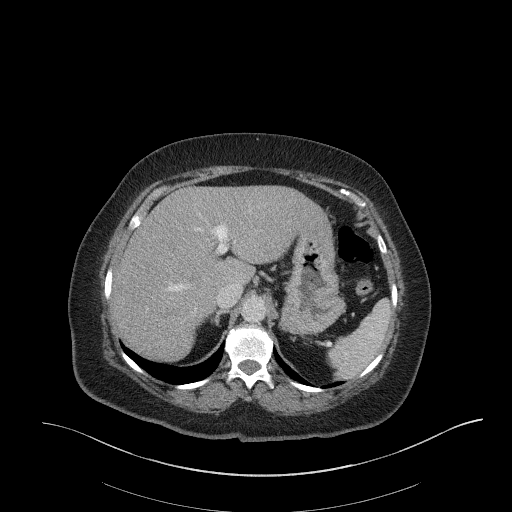
[im 71/91  soft-tissue]
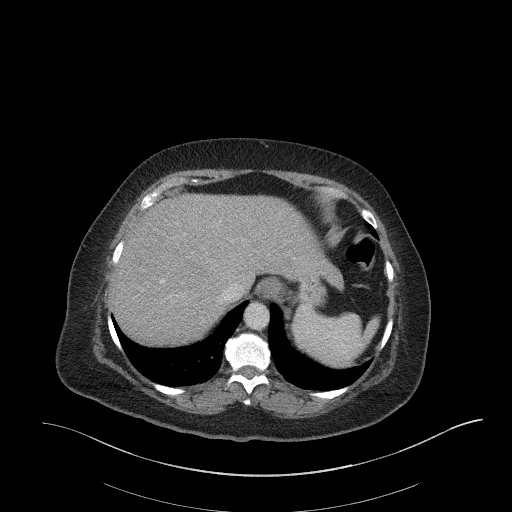
[im 81/91  soft-tissue]
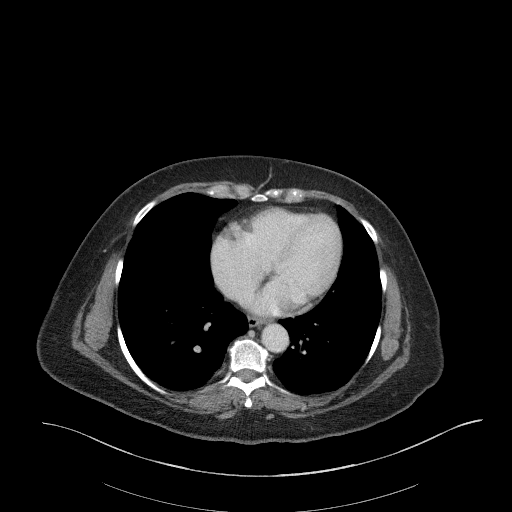
[im 86/91  soft-tissue]
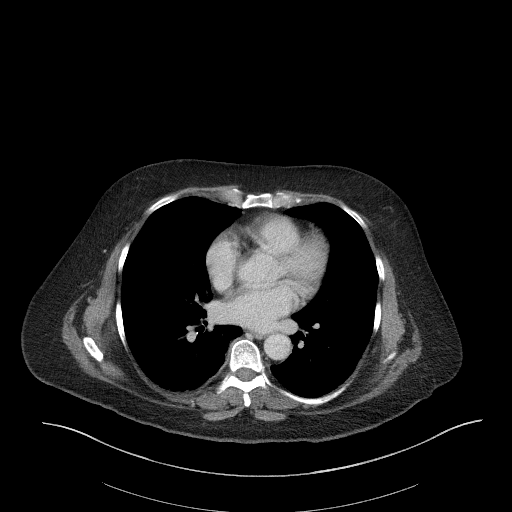

[Series 6: a/p w/ cor · coronal · 0.88mm/px · 3 of 171 slices shown]
[im 57/171  soft-tissue]
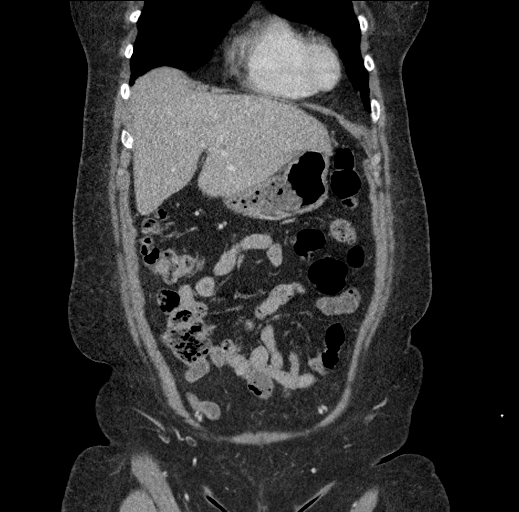
[im 76/171  soft-tissue]
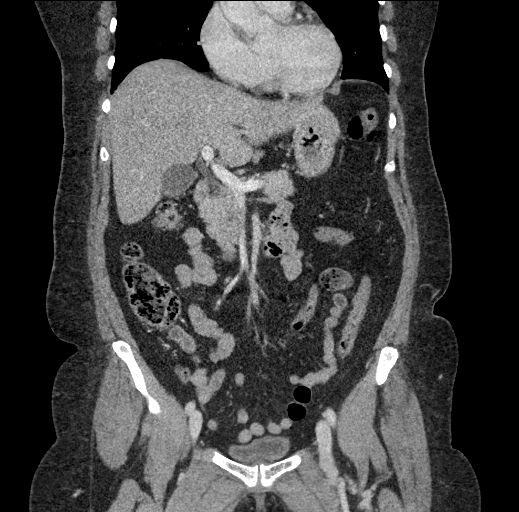
[im 95/171  soft-tissue]
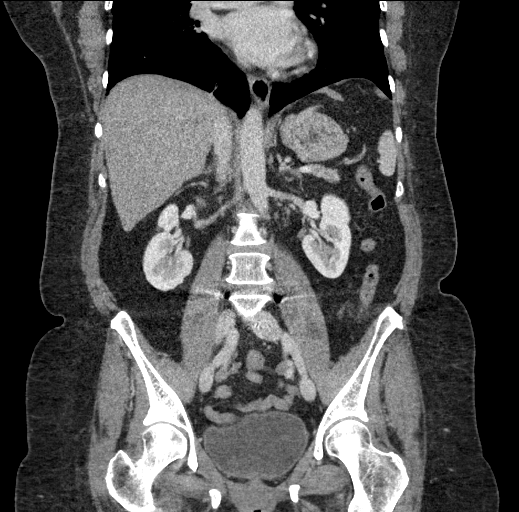

[17 of 46 positions shown; findings below may reference images not displayed]

FINDINGS: Lower chest: No acute abnormality.

Hepatobiliary: The liver and gallbladder are unremarkable. No
biliary dilatation.

Pancreas: Unremarkable

Spleen: Unremarkable

Adrenals/Urinary Tract: The kidneys, adrenal glands and bladder are
unremarkable.

Stomach/Bowel: Stomach is within normal limits. Appendix appears
normal. No evidence of bowel wall thickening, distention, or
inflammatory changes.

Vascular/Lymphatic: No significant vascular findings are present. No
enlarged abdominal or pelvic lymph nodes.

Reproductive: A 4 cm left uterine mass/fibroid noted. No adnexal
mass identified.

Other: No free fluid, collection or pneumoperitoneum.

Musculoskeletal: No acute abnormality. L5-S1 fusion changes again
noted.
IMPRESSION: No evidence of acute abnormality.  Normal appendix.

4 cm left uterine mass/fibroid.

## 2020-07-06 ENCOUNTER — Other Ambulatory Visit: Payer: Self-pay
